# Patient Record
Sex: Female | Born: 1940 | Race: White | Hispanic: No | Marital: Married | Smoking: Never smoker
Health system: Southern US, Community
[De-identification: ages and names within clinical notes are randomized; demographics above are authoritative.]

## PROBLEM LIST (undated history)

## (undated) DIAGNOSIS — C801 Malignant (primary) neoplasm, unspecified: Secondary | ICD-10-CM

---

## 2010-04-03 DIAGNOSIS — C7B8 Other secondary neuroendocrine tumors: Secondary | ICD-10-CM | POA: Insufficient documentation

## 2012-02-10 DIAGNOSIS — D3A00 Benign carcinoid tumor of unspecified site: Secondary | ICD-10-CM | POA: Insufficient documentation

## 2013-04-18 DIAGNOSIS — K648 Other hemorrhoids: Secondary | ICD-10-CM | POA: Insufficient documentation

## 2013-10-16 DIAGNOSIS — R188 Other ascites: Secondary | ICD-10-CM | POA: Insufficient documentation

## 2014-02-05 DIAGNOSIS — N183 Chronic kidney disease, stage 3 unspecified: Secondary | ICD-10-CM | POA: Insufficient documentation

## 2014-11-04 DIAGNOSIS — R197 Diarrhea, unspecified: Secondary | ICD-10-CM | POA: Insufficient documentation

## 2015-02-27 ENCOUNTER — Other Ambulatory Visit: Payer: Self-pay | Admitting: Family Medicine

## 2015-02-27 DIAGNOSIS — Z1231 Encounter for screening mammogram for malignant neoplasm of breast: Secondary | ICD-10-CM

## 2015-03-12 ENCOUNTER — Ambulatory Visit: Payer: Self-pay

## 2015-03-24 ENCOUNTER — Other Ambulatory Visit: Payer: Self-pay | Admitting: Family Medicine

## 2015-03-24 ENCOUNTER — Ambulatory Visit
Admission: RE | Admit: 2015-03-24 | Discharge: 2015-03-24 | Disposition: A | Discharge status: Home / Self Care | Payer: Medicare HMO | Source: Ambulatory Visit | Attending: Family Medicine | Admitting: Family Medicine

## 2015-03-24 DIAGNOSIS — Z1231 Encounter for screening mammogram for malignant neoplasm of breast: Secondary | ICD-10-CM | POA: Diagnosis not present

## 2015-03-24 HISTORY — DX: Malignant (primary) neoplasm, unspecified: C80.1

## 2015-08-11 DIAGNOSIS — I10 Essential (primary) hypertension: Secondary | ICD-10-CM | POA: Insufficient documentation

## 2016-02-12 ENCOUNTER — Other Ambulatory Visit: Payer: Self-pay | Admitting: Family Medicine

## 2016-02-12 DIAGNOSIS — Z1231 Encounter for screening mammogram for malignant neoplasm of breast: Secondary | ICD-10-CM

## 2016-03-22 DIAGNOSIS — C7A8 Other malignant neuroendocrine tumors: Secondary | ICD-10-CM | POA: Insufficient documentation

## 2016-03-22 DIAGNOSIS — C7B8 Other secondary neuroendocrine tumors: Secondary | ICD-10-CM | POA: Insufficient documentation

## 2016-03-29 ENCOUNTER — Ambulatory Visit
Admission: RE | Admit: 2016-03-29 | Discharge: 2016-03-29 | Disposition: A | Discharge status: Home / Self Care | Payer: Medicare HMO | Source: Ambulatory Visit | Attending: Family Medicine | Admitting: Family Medicine

## 2016-03-29 ENCOUNTER — Other Ambulatory Visit: Payer: Self-pay | Admitting: Family Medicine

## 2016-03-29 DIAGNOSIS — Z1231 Encounter for screening mammogram for malignant neoplasm of breast: Secondary | ICD-10-CM

## 2017-02-27 ENCOUNTER — Other Ambulatory Visit: Payer: Self-pay | Admitting: Family Medicine

## 2017-02-27 DIAGNOSIS — Z1231 Encounter for screening mammogram for malignant neoplasm of breast: Secondary | ICD-10-CM

## 2017-04-17 ENCOUNTER — Ambulatory Visit
Admission: RE | Admit: 2017-04-17 | Discharge: 2017-04-17 | Disposition: A | Discharge status: Home / Self Care | Payer: Medicare HMO | Source: Ambulatory Visit | Attending: Family Medicine | Admitting: Family Medicine

## 2017-04-17 DIAGNOSIS — Z1231 Encounter for screening mammogram for malignant neoplasm of breast: Secondary | ICD-10-CM | POA: Diagnosis present

## 2018-02-22 ENCOUNTER — Other Ambulatory Visit: Payer: Self-pay | Admitting: Student

## 2018-02-22 ENCOUNTER — Ambulatory Visit
Admission: RE | Admit: 2018-02-22 | Discharge: 2018-02-22 | Disposition: A | Discharge status: Home / Self Care | Payer: Medicare HMO | Source: Ambulatory Visit | Attending: Student | Admitting: Student

## 2018-02-22 DIAGNOSIS — I82412 Acute embolism and thrombosis of left femoral vein: Secondary | ICD-10-CM | POA: Insufficient documentation

## 2018-02-22 DIAGNOSIS — R600 Localized edema: Secondary | ICD-10-CM | POA: Diagnosis present

## 2018-02-22 DIAGNOSIS — I82432 Acute embolism and thrombosis of left popliteal vein: Secondary | ICD-10-CM | POA: Diagnosis not present

## 2018-03-16 ENCOUNTER — Other Ambulatory Visit: Payer: Self-pay | Admitting: Family Medicine

## 2018-03-16 DIAGNOSIS — Z1231 Encounter for screening mammogram for malignant neoplasm of breast: Secondary | ICD-10-CM

## 2018-04-25 ENCOUNTER — Ambulatory Visit
Admission: RE | Admit: 2018-04-25 | Discharge: 2018-04-25 | Disposition: A | Discharge status: Home / Self Care | Payer: Medicare HMO | Source: Ambulatory Visit | Attending: Family Medicine | Admitting: Family Medicine

## 2018-04-25 DIAGNOSIS — Z1231 Encounter for screening mammogram for malignant neoplasm of breast: Secondary | ICD-10-CM

## 2018-05-09 DIAGNOSIS — I824Z9 Acute embolism and thrombosis of unspecified deep veins of unspecified distal lower extremity: Secondary | ICD-10-CM | POA: Insufficient documentation

## 2018-12-24 ENCOUNTER — Ambulatory Visit (INDEPENDENT_AMBULATORY_CARE_PROVIDER_SITE_OTHER): Payer: Medicare HMO | Admitting: Urology

## 2018-12-24 ENCOUNTER — Encounter: Payer: Self-pay | Admitting: Urology

## 2018-12-24 ENCOUNTER — Other Ambulatory Visit: Payer: Self-pay

## 2018-12-24 VITALS — BP 144/70 | HR 65 | Ht 65.5 in | Wt 134.0 lb

## 2018-12-24 DIAGNOSIS — R310 Gross hematuria: Secondary | ICD-10-CM

## 2018-12-24 LAB — URINALYSIS, COMPLETE
Bilirubin, UA: NEGATIVE
Glucose, UA: NEGATIVE
Ketones, UA: NEGATIVE
Nitrite, UA: NEGATIVE
Protein,UA: NEGATIVE
Specific Gravity, UA: 1.015 (ref 1.005–1.030)
Urobilinogen, Ur: 0.2 mg/dL (ref 0.2–1.0)
pH, UA: 6 (ref 5.0–7.5)

## 2018-12-24 LAB — MICROSCOPIC EXAMINATION: WBC, UA: >30 /hpf — AB (ref 0–5)

## 2018-12-24 NOTE — Patient Instructions (Signed)
Cystoscopy  Cystoscopy is a procedure that is used to help diagnose and sometimes treat conditions that affect that lower urinary tract. The lower urinary tract includes the bladder and the tube that drains urine from the bladder out of the body (urethra). Cystoscopy is performed with a thin, tube-shaped instrument with a light and camera at the end (cystoscope). The cystoscope may be hard (rigid) or flexible, depending on the goal of the procedure.The cystoscope is inserted through the urethra, into the bladder. Cystoscopy may be recommended if you have:  Urinary tractinfections that keep coming back (recurring).  Blood in the urine (hematuria).  Loss of bladder control (urinary incontinence) or an overactive bladder.  Unusual cells found in a urine sample.  A blockage in the urethra.  Painful urination.  An abnormality in the bladder found during an intravenous pyelogram (IVP) or CT scan. Cystoscopy may also be done to remove a sample of tissue to be examined under a microscope (biopsy). Tell a health care provider about:  Any allergies you have.  All medicines you are taking, including vitamins, herbs, eye drops, creams, and over-the-counter medicines.  Any problems you or family members have had with anesthetic medicines.  Any blood disorders you have.  Any surgeries you have had.  Any medical conditions you have.  Whether you are pregnant or may be pregnant. What are the risks? Generally, this is a safe procedure. However, problems may occur, including:  Infection.  Bleeding.  Allergic reactions to medicines.  Damage to other structures or organs. What happens before the procedure?  Ask your health care provider about: ? Changing or stopping your regular medicines. This is especially important if you are taking diabetes medicines or blood thinners. ? Taking medicines such as aspirin and ibuprofen. These medicines can thin your blood. Do not take these medicines  before your procedure if your health care provider instructs you not to.  Follow instructions from your health care provider about eating or drinking restrictions.  You may be given antibiotic medicine to help prevent infection.  You may have an exam or testing, such as X-rays of the bladder, urethra, or kidneys.  You may have urine tests to check for signs of infection.  Plan to have someone take you home after the procedure. What happens during the procedure?  To reduce your risk of infection,your health care team will wash or sanitize their hands.  You will be given one or more of the following: ? A medicine to help you relax (sedative). ? A medicine to numb the area (local anesthetic).  The area around the opening of your urethra will be cleaned.  The cystoscope will be passed through your urethra into your bladder.  Germ-free (sterile)fluid will flow through the cystoscope to fill your bladder. The fluid will stretch your bladder so that your surgeon can clearly examine your bladder walls.  The cystoscope will be removed and your bladder will be emptied. The procedure may vary among health care providers and hospitals. What happens after the procedure?  You may have some soreness or pain in your abdomen and urethra. Medicines will be available to help you.  You may have some blood in your urine.  Do not drive for 24 hours if you received a sedative. This information is not intended to replace advice given to you by your health care provider. Make sure you discuss any questions you have with your health care provider. Document Released: 06/17/2000 Document Revised: 03/31/2017 Document Reviewed: 05/07/2015 Elsevier Interactive Patient   Education  2019 Elsevier Inc.  Hematuria, Adult Hematuria is blood in the urine. Blood may be visible in the urine, or it may be identified with a test. This condition can be caused by infections of the bladder, urethra, kidney, or prostate.  Other possible causes include:  Kidney stones.  Cancer of the urinary tract.  Too much calcium in the urine.  Conditions that are passed from parent to child (inherited conditions).  Exercise that requires a lot of energy. Infections can usually be treated with medicine, and a kidney stone usually will pass through your urine. If neither of these is the cause of your hematuria, more tests may be needed to identify the cause of your symptoms. It is very important to tell your health care provider about any blood in your urine, even if it is painless or the blood stops without treatment. Blood in the urine, when it happens and then stops and then happens again, can be a symptom of a very serious condition, including cancer. There is no pain in the initial stages of many urinary cancers. Follow these instructions at home: Medicines  Take over-the-counter and prescription medicines only as told by your health care provider.  If you were prescribed an antibiotic medicine, take it as told by your health care provider. Do not stop taking the antibiotic even if you start to feel better. Eating and drinking  Drink enough fluid to keep your urine clear or pale yellow. It is recommended that you drink 3-4 quarts (2.8-3.8 L) a day. If you have been diagnosed with an infection, it is recommended that you drink cranberry juice in addition to large amounts of water.  Avoid caffeine, tea, and carbonated beverages. These tend to irritate the bladder.  Avoid alcohol because it may irritate the prostate (men). General instructions  If you have been diagnosed with a kidney stone, follow your health care provider's instructions about straining your urine to catch the stone.  Empty your bladder often. Avoid holding urine for long periods of time.  If you are female: ? After a bowel movement, wipe from front to back and use each piece of toilet paper only once. ? Empty your bladder before and after sex.   Pay attention to any changes in your symptoms. Tell your health care provider about any changes or any new symptoms.  It is your responsibility to get your test results. Ask your health care provider, or the department performing the test, when your results will be ready.  Keep all follow-up visits as told by your health care provider. This is important. Contact a health care provider if:  You develop back pain.  You have a fever.  You have nausea or vomiting.  Your symptoms do not improve after 3 days.  Your symptoms get worse. Get help right away if:  You develop severe vomiting and are unable take medicine without vomiting.  You develop severe pain in your back or abdomen even though you are taking medicine.  You pass a large amount of blood in your urine.  You pass blood clots in your urine.  You feel very weak or like you might faint.  You faint. Summary  Hematuria is blood in the urine. It has many possible causes.  It is very important that you tell your health care provider about any blood in your urine, even if it is painless or the blood stops without treatment.  Take over-the-counter and prescription medicines only as told by your health care   provider.  Drink enough fluid to keep your urine clear or pale yellow. This information is not intended to replace advice given to you by your health care provider. Make sure you discuss any questions you have with your health care provider. Document Released: 06/20/2005 Document Revised: 07/23/2016 Document Reviewed: 07/23/2016 Elsevier Interactive Patient Education  2019 Elsevier Inc.  

## 2018-12-24 NOTE — Progress Notes (Signed)
   12/24/2018 10:41 AM   Sherri Johnson 1941/04/21 [id number]  Referring provider: Maryland Pink, MD [address] [facility] [place],  [place] [address]  CC: Gross hematuria  HPI: I saw Sherri Johnson in urology clinic in consultation from Sherri Johnson for gross hematuria.  She is a 78 year old female with metastatic carcinoid tumor of unclear primary since 2011.  She has stable metastatic disease on most recent imaging with CT with contrast in April 2020.  I am unable to personally review the CT, however the report states there was no hydronephrosis, renal mass, or urolithiasis.  She is followed at [facility] for this, and has been on long-term octreotide.  She reports intermittent Johnson urine on her pad, as well as when she wipes.  She was recently treated for a UTI a few days ago by her gynecologist, and is on Bactrim.  Her pessary(indication prolapse) was also removed with plan for downsizing in the future.  She denies any significant urinary symptoms of urinary leakage, urgency, frequency, or dysuria.  There are no aggravating or alleviating factors.  Duration is approximately 4 months.  Severity is mild.  She has a 50-pack-year smoking history, and quit in 2001.  She denies any other carcinogenic exposures.   PMH: Past Medical History:  Diagnosis Date  . Cancer [facility])    carcinoid   Social History: 50-pack-year smoking history, quit in 2001   ROS: Please see flowsheet from today's date for complete review of systems.  Physical Exam: BP (!) 144/70   Pulse 65   Ht 5' 5.5" (1.664 m)   Wt 134 lb (60.8 kg)   BMI 21.96 kg/m    Constitutional:  Alert and oriented, No acute distress. Cardiovascular: No clubbing, cyanosis, or edema. Respiratory: Normal respiratory effort, no increased work of breathing. GI: Abdomen is soft, nontender, nondistended, no abdominal masses GU: No CVA tenderness Lymph: No cervical or inguinal lymphadenopathy. Skin: No rashes, bruises or suspicious lesions.  Neurologic: Grossly intact, no focal deficits, moving all 4 extremities. Psychiatric: Normal mood and affect.  Laboratory Data: Reviewed  Urinalysis today greater than 30 WBCs, 11-30 RBCs, few bacteria, nitrite negative  Pertinent Imaging: Unable to personally review imaging in [facility] system, CT C/A/P w/contrast from 10/23/2018 with reportedly no hydronephrosis, renal mass, or urolithiasis. Stable metastatic disease.  Assessment & Plan:   In summary, the patient is a 78 year old female with metastatic carcinoid cancer of unclear primary since 2011 who presents with multiple episodes of gross hematuria.  We discussed common possible etiologies of hematuria including malignancy, urolithiasis, medical renal disease, and idiopathic. Standard workup recommended by the AUA includes imaging with CT urogram to assess the upper tracts, and cystoscopy. Cytology is performed on patient's with gross hematuria to look for malignant cells in the urine.  We will not repeat CT with recent imaging in April with contrast Follow-up for cystoscopy and cytology  Sherri Co, MD  [place] [address], [place], [place] [address] [phone]

## 2019-01-03 ENCOUNTER — Other Ambulatory Visit: Payer: Self-pay

## 2019-01-03 ENCOUNTER — Ambulatory Visit: Payer: Medicare HMO | Admitting: Urology

## 2019-01-03 ENCOUNTER — Encounter: Payer: Self-pay | Admitting: Urology

## 2019-01-03 VITALS — BP 123/76 | HR 67 | Ht 65.0 in | Wt 140.0 lb

## 2019-01-03 DIAGNOSIS — R310 Gross hematuria: Secondary | ICD-10-CM | POA: Diagnosis not present

## 2019-01-03 LAB — MICROSCOPIC EXAMINATION: RBC: NONE SEEN /hpf (ref 0–2)

## 2019-01-03 LAB — URINALYSIS, COMPLETE
Bilirubin, UA: NEGATIVE
Glucose, UA: NEGATIVE
Ketones, UA: NEGATIVE
Nitrite, UA: NEGATIVE
Protein,UA: NEGATIVE
RBC, UA: NEGATIVE
Specific Gravity, UA: 1.01 (ref 1.005–1.030)
Urobilinogen, Ur: 0.2 mg/dL (ref 0.2–1.0)
pH, UA: 5.5 (ref 5.0–7.5)

## 2019-01-03 NOTE — Progress Notes (Signed)
Cystoscopy Procedure Note:  Indication: Gross hematuria  After informed consent and discussion of the procedure and its risks, Sherri Johnson was positioned and prepped in the standard fashion. Cystoscopy was performed with a flexible cystoscope. The urethra, bladder neck and entire bladder was visualized in a standard fashion. The ureteral orifices were visualized in their normal location and orientation. Mucosa normal throughout, no abnormalities on retroflexion. Urine cytology was sent.  Imaging: CT A/P w/contrast in April 2020 with no hydronephrosis, renal masses, or nephrolithiasis  Findings: Normal cystoscopy  Assessment and Plan: Call with urine cytology results Follow up PRN  Nickolas Madrid, MD 01/03/2019

## 2019-01-09 ENCOUNTER — Other Ambulatory Visit: Payer: Self-pay | Admitting: Urology

## 2019-01-10 ENCOUNTER — Telehealth: Payer: Self-pay

## 2019-01-10 NOTE — Progress Notes (Signed)
Please call to let her know no worrisome cells in the urine sample from cystoscopy, follow up as needed  Nickolas Madrid, MD 01/10/2019

## 2019-01-10 NOTE — Telephone Encounter (Signed)
-----   Message from Billey Co, MD sent at 01/10/2019  7:39 AM EDT -----   ----- Message ----- From: Delon Sacramento D Sent: 01/09/2019  11:55 AM EDT To: Billey Co, MD

## 2019-01-10 NOTE — Telephone Encounter (Signed)
Please call to let her know no worrisome cells in the urine sample from cystoscopy, follow up as needed  Nickolas Madrid, MD 01/10/2019  Patient notified

## 2019-03-27 ENCOUNTER — Other Ambulatory Visit: Payer: Self-pay | Admitting: Family Medicine

## 2019-03-27 DIAGNOSIS — Z1231 Encounter for screening mammogram for malignant neoplasm of breast: Secondary | ICD-10-CM

## 2019-05-02 ENCOUNTER — Ambulatory Visit
Admission: RE | Admit: 2019-05-02 | Discharge: 2019-05-02 | Disposition: A | Discharge status: Home / Self Care | Payer: Medicare HMO | Source: Ambulatory Visit | Attending: Family Medicine | Admitting: Family Medicine

## 2019-05-02 DIAGNOSIS — Z1231 Encounter for screening mammogram for malignant neoplasm of breast: Secondary | ICD-10-CM | POA: Insufficient documentation

## 2020-03-19 ENCOUNTER — Other Ambulatory Visit: Payer: Self-pay | Admitting: Family Medicine

## 2020-03-20 ENCOUNTER — Other Ambulatory Visit: Payer: Self-pay | Admitting: Family Medicine

## 2020-03-20 DIAGNOSIS — Z1231 Encounter for screening mammogram for malignant neoplasm of breast: Secondary | ICD-10-CM

## 2020-05-04 ENCOUNTER — Other Ambulatory Visit: Payer: Self-pay

## 2020-05-04 ENCOUNTER — Ambulatory Visit
Admission: RE | Admit: 2020-05-04 | Discharge: 2020-05-04 | Disposition: A | Discharge status: Home / Self Care | Payer: Medicare HMO | Source: Ambulatory Visit | Attending: Family Medicine | Admitting: Family Medicine

## 2020-05-04 DIAGNOSIS — Z1231 Encounter for screening mammogram for malignant neoplasm of breast: Secondary | ICD-10-CM

## 2021-03-04 ENCOUNTER — Other Ambulatory Visit: Payer: Self-pay

## 2021-03-04 ENCOUNTER — Ambulatory Visit: Payer: Medicare HMO | Admitting: Urology

## 2021-03-04 DIAGNOSIS — N3281 Overactive bladder: Secondary | ICD-10-CM

## 2021-03-04 DIAGNOSIS — R350 Frequency of micturition: Secondary | ICD-10-CM

## 2021-03-04 LAB — BLADDER SCAN AMB NON-IMAGING: Scan Result: 0

## 2021-03-04 NOTE — Progress Notes (Signed)
   03/04/2021 2:28 PM   Sherri Johnson 03-Nov-1940 [id number]  Reason for visit: Overactive bladder, history of gross hematuria  HPI: I saw [name] and her husband today for evaluation of overactive bladder.  I previously saw her in 2020 for gross hematuria and she had a negative work-up with a CT abdomen pelvis with contrast and a cystoscopy with negative cytology.  She has metastatic carcinoid tumor of unclear primary since 2011 that is essentially stable, and is followed closely at [facility].  She removed her pessary(indication prolapse) a few months ago and reports some increase in urinary urgency since that time.  She has some mild to moderate urge incontinence that requires a pad 24 hours a day.  She has 2-3 alcoholic beverages before bed, and has nocturia a few times overnight.  She drinks primarily water during the day, but some tea.  She denies any dysuria, gross hematuria, or pelvic pain.  She denies significant stress incontinence.  She has never tried any medications for this before.  Urinalysis today is completely benign, and PVR is 0 mL.  CT abdomen and pelvis from March 2022 shows no urologic abnormalities, hydronephrosis, stones, or bladder abnormalities.  I reviewed the outside oncology notes.  We discussed that overactive bladder (OAB) is not a disease, but is a symptom complex that is generally not life-threatening.  Symptoms typically include urinary urgency, frequency, and urge incontinence.  There are numerous treatment options, however there are risks and benefits with both medical and surgical management.  First-line treatment is behavioral therapies including bladder training, pelvic floor muscle training, and fluid management.  Second line treatments include oral antimuscarinics(Ditropan er, Trospium) and beta-3 agonist (Mybetriq). There is typically a period of medication trial (4-8 weeks) to find the optimal therapy and dosing. If symptoms are bothersome despite the above  management, third line options include intra-detrusor botox, peripheral tibial nerve stimulation (PTNS), and interstim (SNS). These are more invasive treatments with higher side effect profile, but may improve quality of life for patients with severe OAB symptoms.   -Trial of Myrbetriq 25 mg daily, samples given -RTC PA 1 month symptom check -If doing well would continue Myrbetriq.  If persistent symptoms consider resuming pessary via GYN, or follow-up with Dr. Matilde Sprang if she would like to explore other options   Billey Co, [place] [address] [place], [place] [address] [phone]

## 2021-03-04 NOTE — Patient Instructions (Signed)

## 2021-03-05 LAB — MICROSCOPIC EXAMINATION
Bacteria, UA: NONE SEEN
RBC, Urine: NONE SEEN /hpf (ref 0–2)

## 2021-03-05 LAB — URINALYSIS, COMPLETE
Bilirubin, UA: NEGATIVE
Glucose, UA: NEGATIVE
Ketones, UA: NEGATIVE
Nitrite, UA: NEGATIVE
Protein,UA: NEGATIVE
RBC, UA: NEGATIVE
Specific Gravity, UA: 1.015 (ref 1.005–1.030)
Urobilinogen, Ur: 0.2 mg/dL (ref 0.2–1.0)
pH, UA: 6.5 (ref 5.0–7.5)

## 2021-03-30 ENCOUNTER — Telehealth: Payer: Self-pay | Admitting: Urology

## 2021-03-30 NOTE — Telephone Encounter (Signed)
Patient last saw Dr. Diamantina Providence on 03/04/2021 and he gave her samples of Myrbetriq. She has run out and would like some more. She is scheduled to see Larene Beach on 10/05. Patient is requesting a call back.

## 2021-03-30 NOTE — Telephone Encounter (Signed)
Called pt, she states she has not noticed any difference yet with Myrbetriq samples she would like to try a few more weeks. Samples left up front with reception.

## 2021-04-05 ENCOUNTER — Other Ambulatory Visit: Payer: Self-pay | Admitting: Family Medicine

## 2021-04-05 DIAGNOSIS — Z1231 Encounter for screening mammogram for malignant neoplasm of breast: Secondary | ICD-10-CM

## 2021-04-06 ENCOUNTER — Ambulatory Visit: Payer: Medicare HMO | Admitting: Urology

## 2021-04-06 NOTE — Progress Notes (Signed)
04/07/2021 11:52 AM   Sherri Johnson Nov 02, 1940 [id number]  Referring provider: Maryland Pink, MD [address] [facility] [place],  [place] [address]  Chief Complaint  Patient presents with   Urinary Incontinence   Urological history: 1. High risk hematuria -non-smoker -CT urogram imaging in [facility] system, CT C/A/P w/contrast from 10/23/2018 with reportedly no hydronephrosis, renal mass, or urolithiasis. Stable metastatic disease -cysto 01/2019 NED -urine cytology 01/2019 - negative -no reports of gross heme -UA neg for micro heme 03/2021  2. Urge incontinence -contributing factors of age, vaginal atrophy, alcohol consumption, depression, pelvic floor laxity and diuretic -PVR 19 mL -managed with Myrbetriq 25 mg daily   HPI: Sherri Johnson is a 80 y.o. female who presents today for one month follow up after a trial of Myrbetriq 25 mg daily.  PVR 19 mL    She states the urgency is somewhat better, but she has seen an acute change with the Myrbetriq 25 mg.  She is also experiencing intermittent dysuria that does not find bothersome.  Patient denies any modifying or aggravating factors.  Patient denies any gross hematuria or suprapubic/flank pain.  Patient denies any fevers, chills, nausea or vomiting.    PMH: Past Medical History:  Diagnosis Date   Cancer [facility])    carcinoid    Surgical History: No past surgical history on file.  Home Medications:  Allergies as of 04/07/2021       Reactions   Everolimus Other (See Comments)   Inflammation of the lungs   Trazodone Other (See Comments)   Increased seratonin Increased seratonin   Paroxetine Other (See Comments)   Increased Seratonin.  Dizziness Increased Seratonin.  Dizziness        Medication List        Accurate as of April 07, 2021 11:52 AM. If you have any questions, ask your nurse or doctor.          aliskiren 300 MG tablet Commonly known as: TEKTURNA TAKE 1 TABLET BY MOUTH EVERY DAY    allopurinol 300 MG tablet Commonly known as: ZYLOPRIM TAKE 1 TABLET BY MOUTH EVERY DAY   amLODipine 5 MG tablet Commonly known as: NORVASC Take by mouth.   Biotin 1000 MCG tablet Take by mouth.   buPROPion 150 MG 24 hr tablet Commonly known as: WELLBUTRIN XL Take 150 mg by mouth every morning.   furosemide 20 MG tablet Commonly known as: LASIX TAKE 1 TABLET BY MOUTH EVERY DAY   losartan 100 MG tablet Commonly known as: COZAAR Take by mouth.   melatonin 3 MG Tabs tablet Take by mouth.   metoprolol succinate 100 MG 24 hr tablet Commonly known as: TOPROL-XL Take 100 mg by mouth daily.   mirabegron ER 25 MG Tb24 tablet Commonly known as: MYRBETRIQ Take by mouth.   Omega 3 1000 MG Caps Take by mouth.   Xarelto 20 MG Tabs tablet Generic drug: rivaroxaban Take 20 mg by mouth every morning.        Allergies:  Allergies  Allergen Reactions   Everolimus Other (See Comments)    Inflammation of the lungs   Trazodone Other (See Comments)    Increased seratonin Increased seratonin    Paroxetine Other (See Comments)    Increased Seratonin.  Dizziness Increased Seratonin.  Dizziness     Family History: Family History  Problem Relation Age of Onset   Breast cancer Daughter 43    Social History:  reports that she has never smoked. She has  never used smokeless tobacco. She reports current alcohol use. No history on file for drug use.  ROS: Pertinent ROS in HPI  Physical Exam: BP (!) 152/90   Pulse 67   Ht _0  (1.651 m)   Wt 134 lb (60.8 kg)   BMI 22.30 kg/m   Constitutional:  Well nourished. Alert and oriented, No acute distress. HEENT: [place] AT, mask in place.  Trachea midline Cardiovascular: No clubbing, cyanosis, or edema. Respiratory: Normal respiratory effort, no increased work of breathing. Neurologic: Grossly intact, no focal deficits, moving all 4 extremities. Psychiatric: Normal mood and affect.    Laboratory Data: WBC (White Blood Cell  Count) 3.2 - 9.8 x10^9/L 5.4   Hemoglobin 12.0 - 15.5 g/dL 11.9 Low    Hematocrit 35.0 - 45.0 % 36.4   Platelets 150 - 450 x10^9/L 160   MCV (Mean Corpuscular Volume) 80 - 98 fL 101 High    MCH (Mean Corpuscular Hemoglobin) 26.5 - 34.0 pg 33.1   MCHC (Mean Corpuscular Hemoglobin Concentration) 31.4 - 36.0 % 32.7   RBC (Red Blood Cell Count) 3.77 - 5.16 x10^12/L 3.60 Low    RDW-CV (Red Cell Distribution Width) 11.5 - 14.5 % 14.0   NRBC (Nucleated Red Blood Cell Count) 0 x10^9/L 0.00   NRBC % (Nucleated Red Blood Cell %) % 0.0   MPV (Mean Platelet Volume) 7.2 - 11.7 fL 11.1   Neutrophil Count 2.0 - 8.6 x10^9/L 4.3   Neutrophil % 37 - 80 % 79.4   Lymphocyte Count 0.6 - 4.2 x10^9/L 0.6   Lymphocyte % 10 - 50 % 11.0   Monocyte Count 0 - 0.9 x10^9/L 0.4   Monocyte % 0 - 12 % 7.9   Eosinophil Count 0 - 0.70 x10^9/L 0.05   Eosinophil % 0 - 7 % 0.9   Basophil Count 0 - 0.20 x10^9/L 0.01   Basophil % 0 - 2 % 0.2   Immature Granulocyte Count <=0.06 x10^9/L 0.03   Immature Granulocyte % <=0.7 % 0.6   Resulting Agency  [facility]  Specimen Collected: 03/23/21 10:23 Last Resulted: 03/23/21 10:43  Received From: [place]  Result Received: 03/30/21 09:49   Sodium 135 - 145 mmol/L 142   Potassium 3.5 - 5.0 mmol/L 3.3 Low    Chloride 98 - 108 mmol/L 106   Carbon Dioxide (CO2) 21 - 30 mmol/L 28   Urea Nitrogen (BUN) 7 - 20 mg/dL 18   Creatinine 0.4 - 1.0 mg/dL 1.2 High    Glucose 70 - 140 mg/dL 117   Comment: Interpretive Data:  Above is the NONFASTING reference range.   Below are the FASTING reference ranges:  NORMAL:      70-99 mg/dL  PREDIABETES: 100-125 mg/dL  DIABETES:    > 125 mg/dL   Calcium 8.7 - 10.2 mg/dL 8.8   AST (Aspartate Aminotransferase) 15 - 41 U/L 32   ALT (Alanine Aminotransferase) 14 - 54 U/L 18   Bilirubin, Total 0.4 - 1.5 mg/dL 0.8   Alk Phos (Alkaline Phosphatase) 24 - 110 U/L 74   Albumin 3.5 - 4.8 g/dL 3.1 Low     Protein, Total 6.2 - 8.1 g/dL 5.8 Low    Anion Gap 3 - 12 mmol/L 8   BUN/CREA Ratio 6 - 27 15   Glomerular Filtration Rate (eGFR)  mL/min/1.73sq m 46   Comment: CKD-EPI (2021) does not include patient's race in the calculation of eGFR. Monitoring changes of plasma creatinine and eGFR  over time is useful for monitoring kidney function.  This change was made on 09/01/2020.   Interpretive Ranges for eGFR(CKD-EPI 2021):   eGFR:              > 60 mL/min/1.73 sq m - Normal  eGFR:              30 - 59 mL/min/1.73 sq m - Moderately Decreased  eGFR:              15 - 29 mL/min/1.73 sq m - Severely Decreased  eGFR:              < 15 mL/min/1.73 sq m -  Kidney Failure    Note: These eGFR calculations do not apply in acute situations  when eGFR is changing rapidly or in patients on dialysis.   Resulting Agency  [facility]  Specimen Collected: 03/23/21 10:23 Last Resulted: 03/23/21 11:08  Received From: [place]  Result Received: 03/30/21 09:49  I have reviewed the labs.   Pertinent Imaging: Results for LORELEY, SCHWALL (MRN [id number]) as of 04/07/2021 11:32  Ref. Range 04/07/2021 10:55  Scan Result Unknown 64m   Assessment & Plan:    1. Urge incontinence -no improvement with Myrbetriq 25 mg daily -she has two more weeks of Myrbetriq which she will finish -the Myrbetriq is going to be cost prohibitive, so it is not a sustainable option at this time -we discussed trying another medication, but I'm concerned that the OAB medications that are covered by her insurance are likely going to be from the anticholinergic family and I advised her of the side effect profile of these medications and she would like to hold at this time -[facility]  2. Dysuria -intermittent and not bothersome -will continue to monitor -[facility]     Return for Patient will follow up  after she is seen by urogyn .  These notes generated with voice recognition software. I apologize for typographical errors.  SZara Council PA-C  [facility] [address] [place] [phone]

## 2021-04-07 ENCOUNTER — Other Ambulatory Visit: Payer: Self-pay

## 2021-04-07 ENCOUNTER — Encounter: Payer: Self-pay | Admitting: Urology

## 2021-04-07 ENCOUNTER — Ambulatory Visit: Payer: Medicare HMO | Admitting: Urology

## 2021-04-07 VITALS — BP 152/90 | HR 67 | Ht 65.0 in | Wt 134.0 lb

## 2021-04-07 DIAGNOSIS — N3941 Urge incontinence: Secondary | ICD-10-CM

## 2021-04-07 DIAGNOSIS — R300 Dysuria: Secondary | ICD-10-CM | POA: Diagnosis not present

## 2021-04-07 LAB — BLADDER SCAN AMB NON-IMAGING

## 2021-05-05 ENCOUNTER — Ambulatory Visit
Admission: RE | Admit: 2021-05-05 | Discharge: 2021-05-05 | Disposition: A | Discharge status: Home / Self Care | Payer: Medicare HMO | Source: Ambulatory Visit | Attending: Family Medicine | Admitting: Family Medicine

## 2021-05-05 ENCOUNTER — Other Ambulatory Visit: Payer: Self-pay

## 2021-05-05 DIAGNOSIS — Z1231 Encounter for screening mammogram for malignant neoplasm of breast: Secondary | ICD-10-CM | POA: Diagnosis present

## 2021-11-25 IMAGING — MG DIGITAL SCREENING BILAT W/ TOMO W/ CAD
6 of 10 series · 6 of 30 positions shown · non-contrast
Comparison: Previous exam(s).

CLINICAL DATA: Screening.

EXAM:
DIGITAL SCREENING BILATERAL MAMMOGRAM WITH TOMO AND CAD

[L MLO synth-2D (1 of 2)]
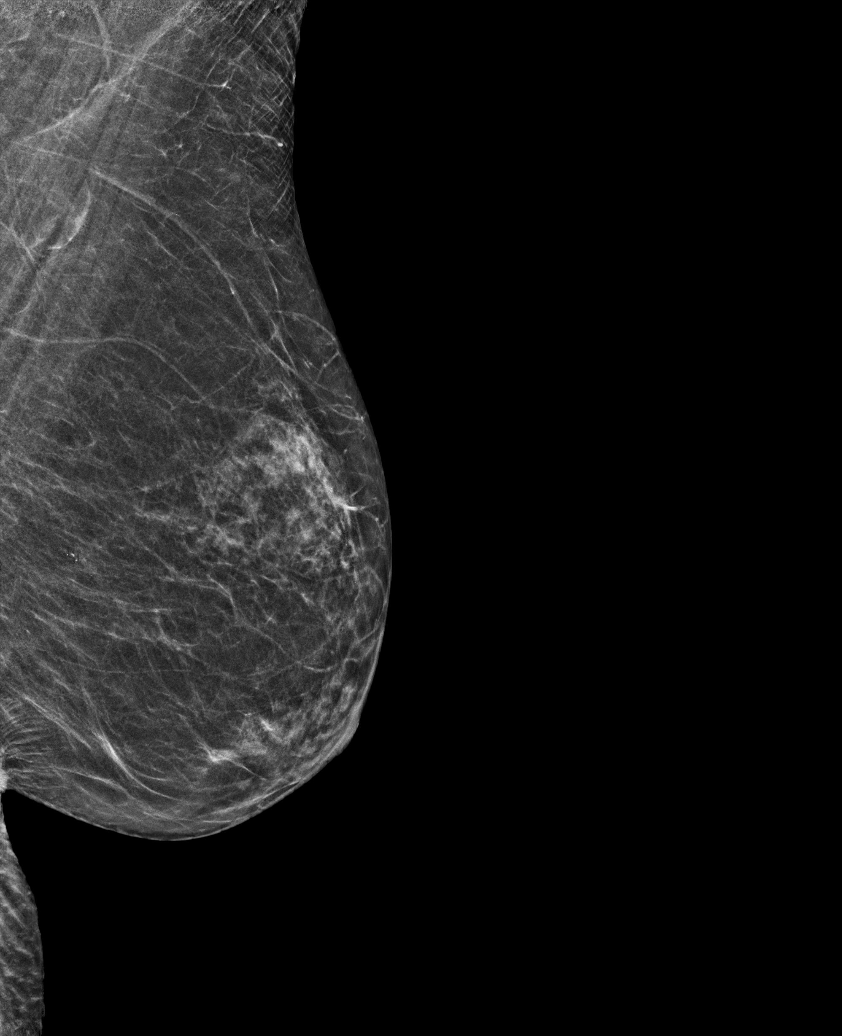

[R CC synth-2D]
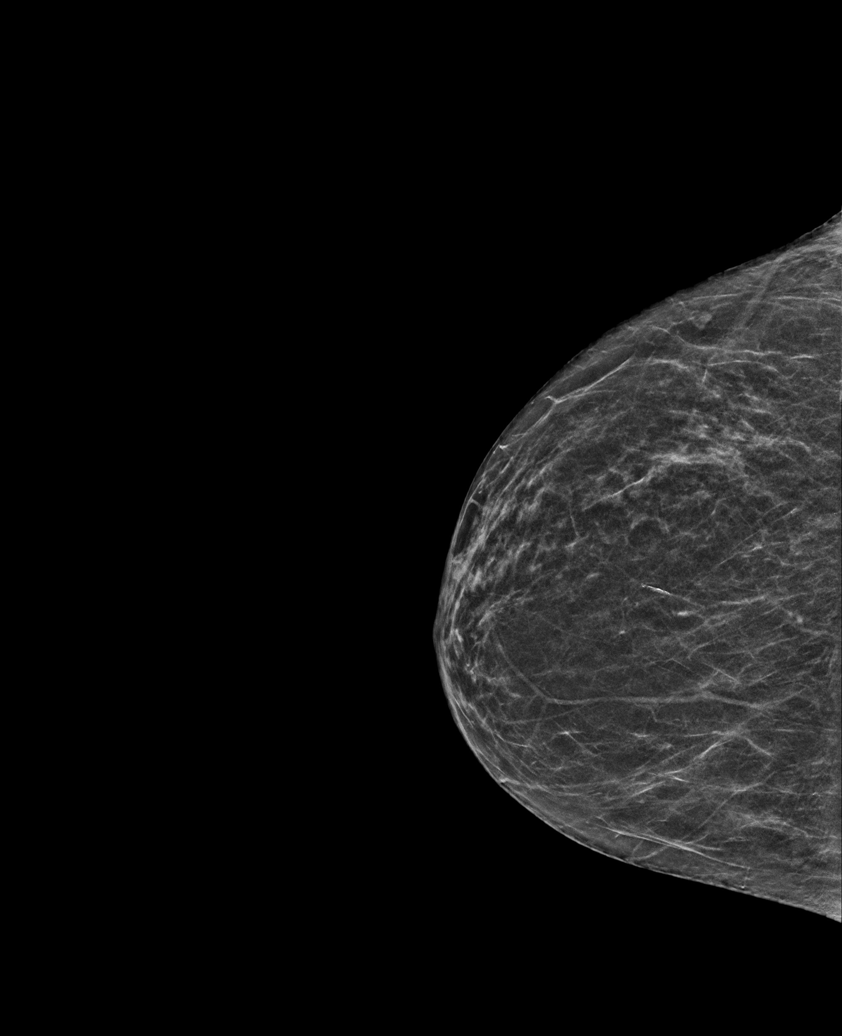

[L CC synth-2D]
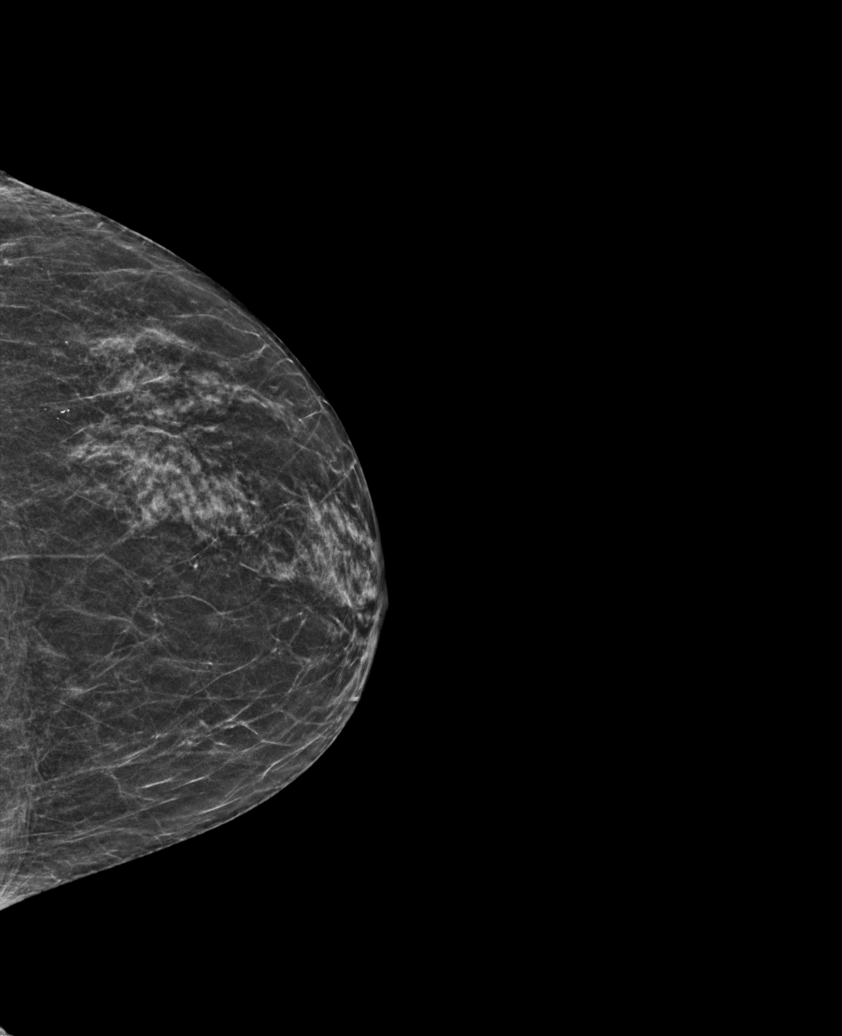

[L MLO synth-2D (2 of 2)]
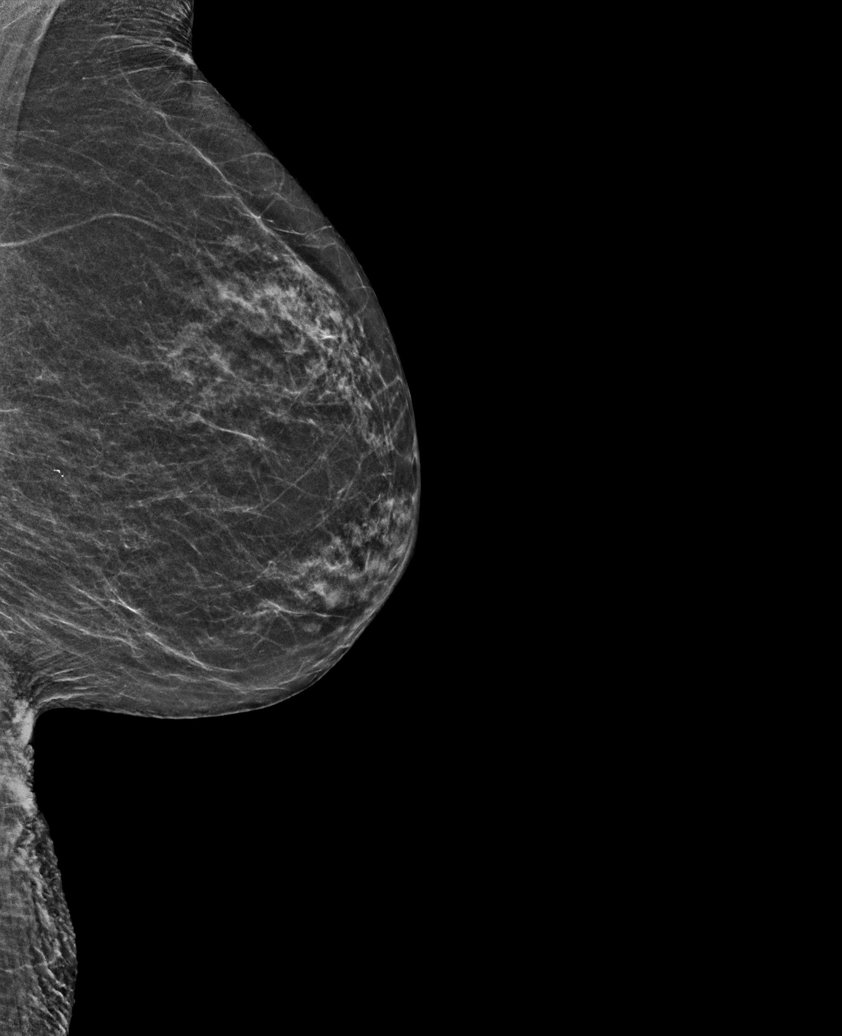

[R MLO synth-2D]
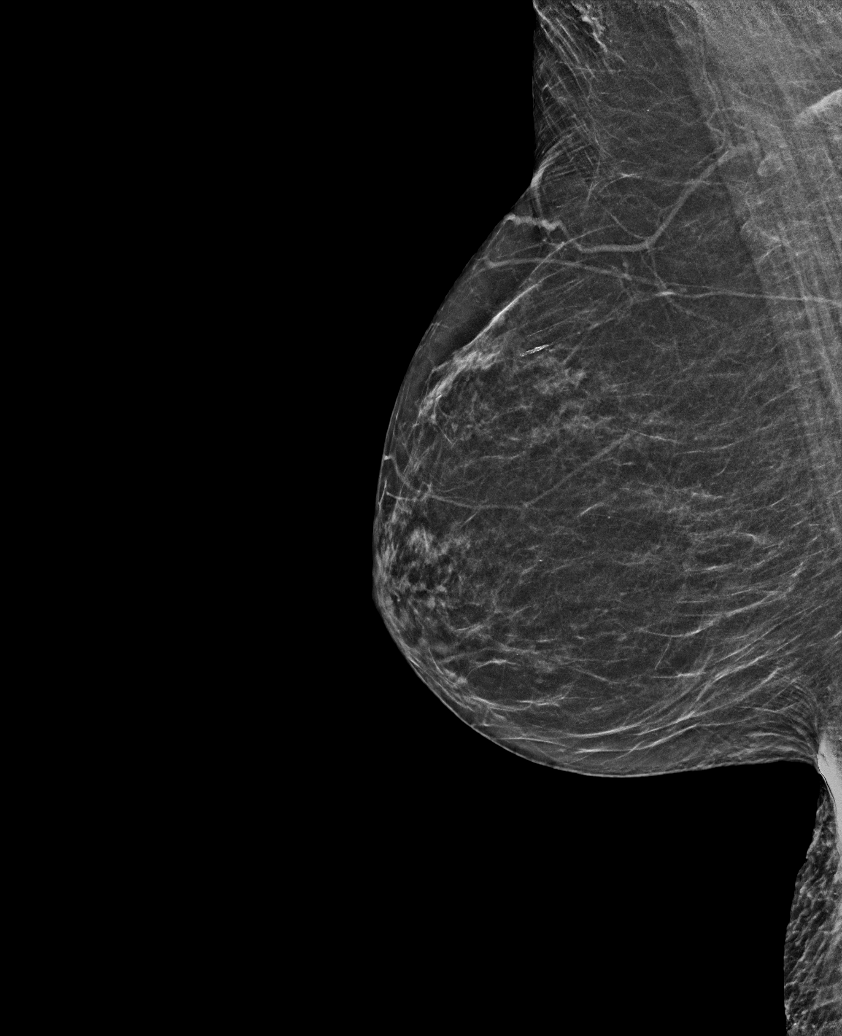

[R MLO tomo · tomo slice 25/49.0]
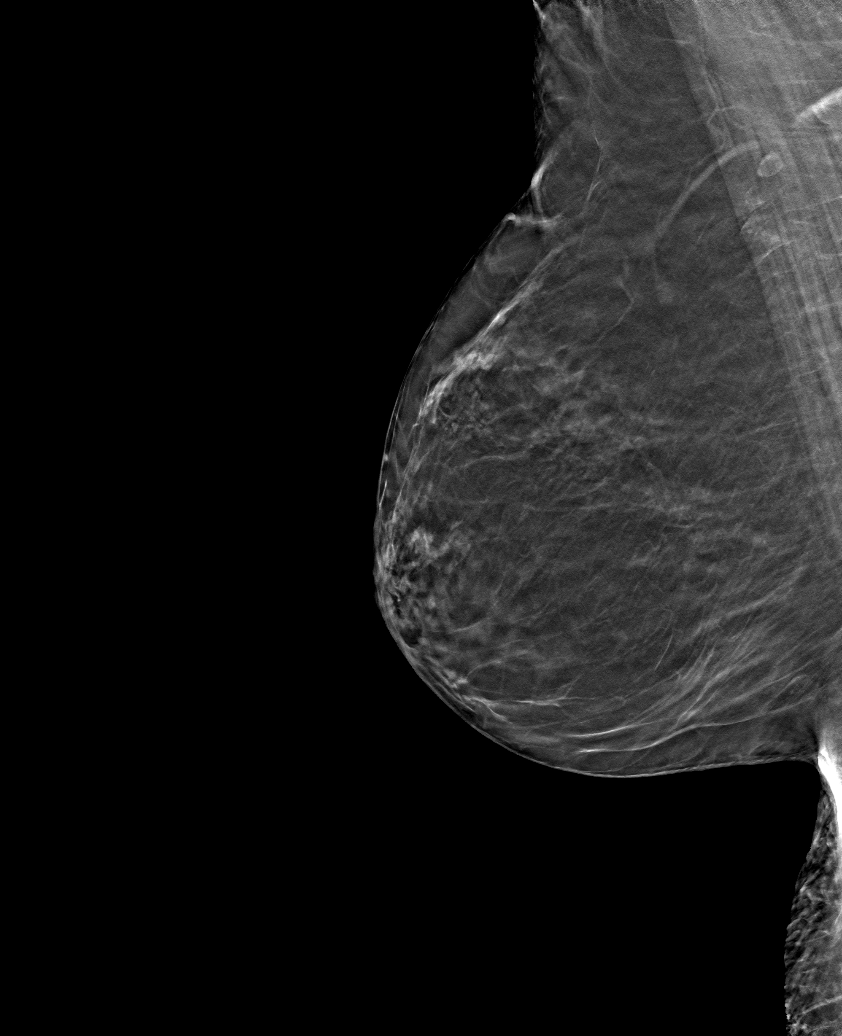

[6 of 30 positions shown; findings below may reference images not displayed]

ACR Breast Density Category b: There are scattered areas of
fibroglandular density.
FINDINGS: There are no findings suspicious for malignancy. Images were
processed with CAD.
IMPRESSION: No mammographic evidence of malignancy. A result letter of this
screening mammogram will be mailed directly to the patient.

RECOMMENDATION:
Screening mammogram in one year. (Code:[id number])

BI-RADS CATEGORY  1: Negative.

## 2022-02-01 DEATH — deceased

## 2022-04-11 ENCOUNTER — Other Ambulatory Visit: Payer: Self-pay | Admitting: Family Medicine

## 2022-04-11 DIAGNOSIS — Z1231 Encounter for screening mammogram for malignant neoplasm of breast: Secondary | ICD-10-CM

## 2022-05-09 ENCOUNTER — Ambulatory Visit
Admission: RE | Admit: 2022-05-09 | Discharge: 2022-05-09 | Disposition: A | Discharge status: Home / Self Care | Payer: Medicare HMO | Source: Ambulatory Visit | Attending: Family Medicine | Admitting: Family Medicine

## 2022-05-09 DIAGNOSIS — Z1231 Encounter for screening mammogram for malignant neoplasm of breast: Secondary | ICD-10-CM | POA: Insufficient documentation

## 2022-06-02 ENCOUNTER — Other Ambulatory Visit (HOSPITAL_COMMUNITY): Payer: Self-pay | Admitting: Neurology

## 2022-06-02 DIAGNOSIS — G3184 Mild cognitive impairment, so stated: Secondary | ICD-10-CM

## 2022-06-11 ENCOUNTER — Ambulatory Visit (HOSPITAL_COMMUNITY)
Admission: RE | Admit: 2022-06-11 | Discharge: 2022-06-11 | Disposition: A | Discharge status: Home / Self Care | Payer: Medicare HMO | Source: Ambulatory Visit | Attending: Neurology | Admitting: Neurology

## 2022-06-11 DIAGNOSIS — G3184 Mild cognitive impairment, so stated: Secondary | ICD-10-CM | POA: Diagnosis present

## 2022-08-04 DEATH — deceased
